# Patient Record
Sex: Male | Born: 1990 | Race: White | Hispanic: No | Marital: Married | Smoking: Current every day smoker
Health system: Southern US, Community
[De-identification: ages and names within clinical notes are randomized; demographics above are authoritative.]

---
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
## 2010-09-05 ENCOUNTER — Emergency Department: Payer: Self-pay | Admitting: Emergency Medicine

## 2012-01-23 ENCOUNTER — Emergency Department: Payer: Self-pay | Admitting: Emergency Medicine

## 2012-03-03 ENCOUNTER — Emergency Department: Payer: Self-pay | Admitting: Emergency Medicine

## 2012-03-03 LAB — COMPREHENSIVE METABOLIC PANEL
Anion Gap: 4 — ABNORMAL LOW (ref 7–16)
Chloride: 104 mmol/L (ref 98–107)
Co2: 32 mmol/L (ref 21–32)
EGFR (African American): >60
EGFR (Non-African Amer.): >60
Osmolality: 278 (ref 275–301)
SGOT(AST): 26 U/L (ref 15–37)
SGPT (ALT): 23 U/L (ref 12–78)

## 2012-03-03 LAB — CBC WITH DIFFERENTIAL/PLATELET
Basophil #: 0 10*3/uL (ref 0.0–0.1)
Eosinophil #: 0.2 10*3/uL (ref 0.0–0.7)
HCT: 45.8 % (ref 40.0–52.0)
HGB: 15.5 g/dL (ref 13.0–18.0)
Lymphocyte #: 1.8 10*3/uL (ref 1.0–3.6)
MCH: 30.7 pg (ref 26.0–34.0)
MCHC: 33.9 g/dL (ref 32.0–36.0)
MCV: 91 fL (ref 80–100)
Monocyte #: 0.6 x10 3/mm (ref 0.2–1.0)
Monocyte %: 9.1 %
Neutrophil #: 3.9 10*3/uL (ref 1.4–6.5)
Platelet: 176 10*3/uL (ref 150–440)
RDW: 12.9 % (ref 11.5–14.5)
WBC: 6.5 10*3/uL (ref 3.8–10.6)

## 2012-05-19 IMAGING — CR DG CHEST 2V
1 series · 2 of 2 positions shown · non-contrast
Comparison: none

REASON FOR EXAM: cough; flex 2
COMMENTS:

PROCEDURE:     DXR - DXR CHEST PA (OR AP) AND LATERAL  - September 05, 2010  [DATE]
RESULT:     The lungs are clear. The heart and pulmonary vessels are normal.
The bony and mediastinal structures are unremarkable. There is no effusion.
There is no pneumothorax or evidence of congestive failure.

[Series 1: view not recorded · 0.17mm/px · 2 of 2 slices shown]
[im 1/2]
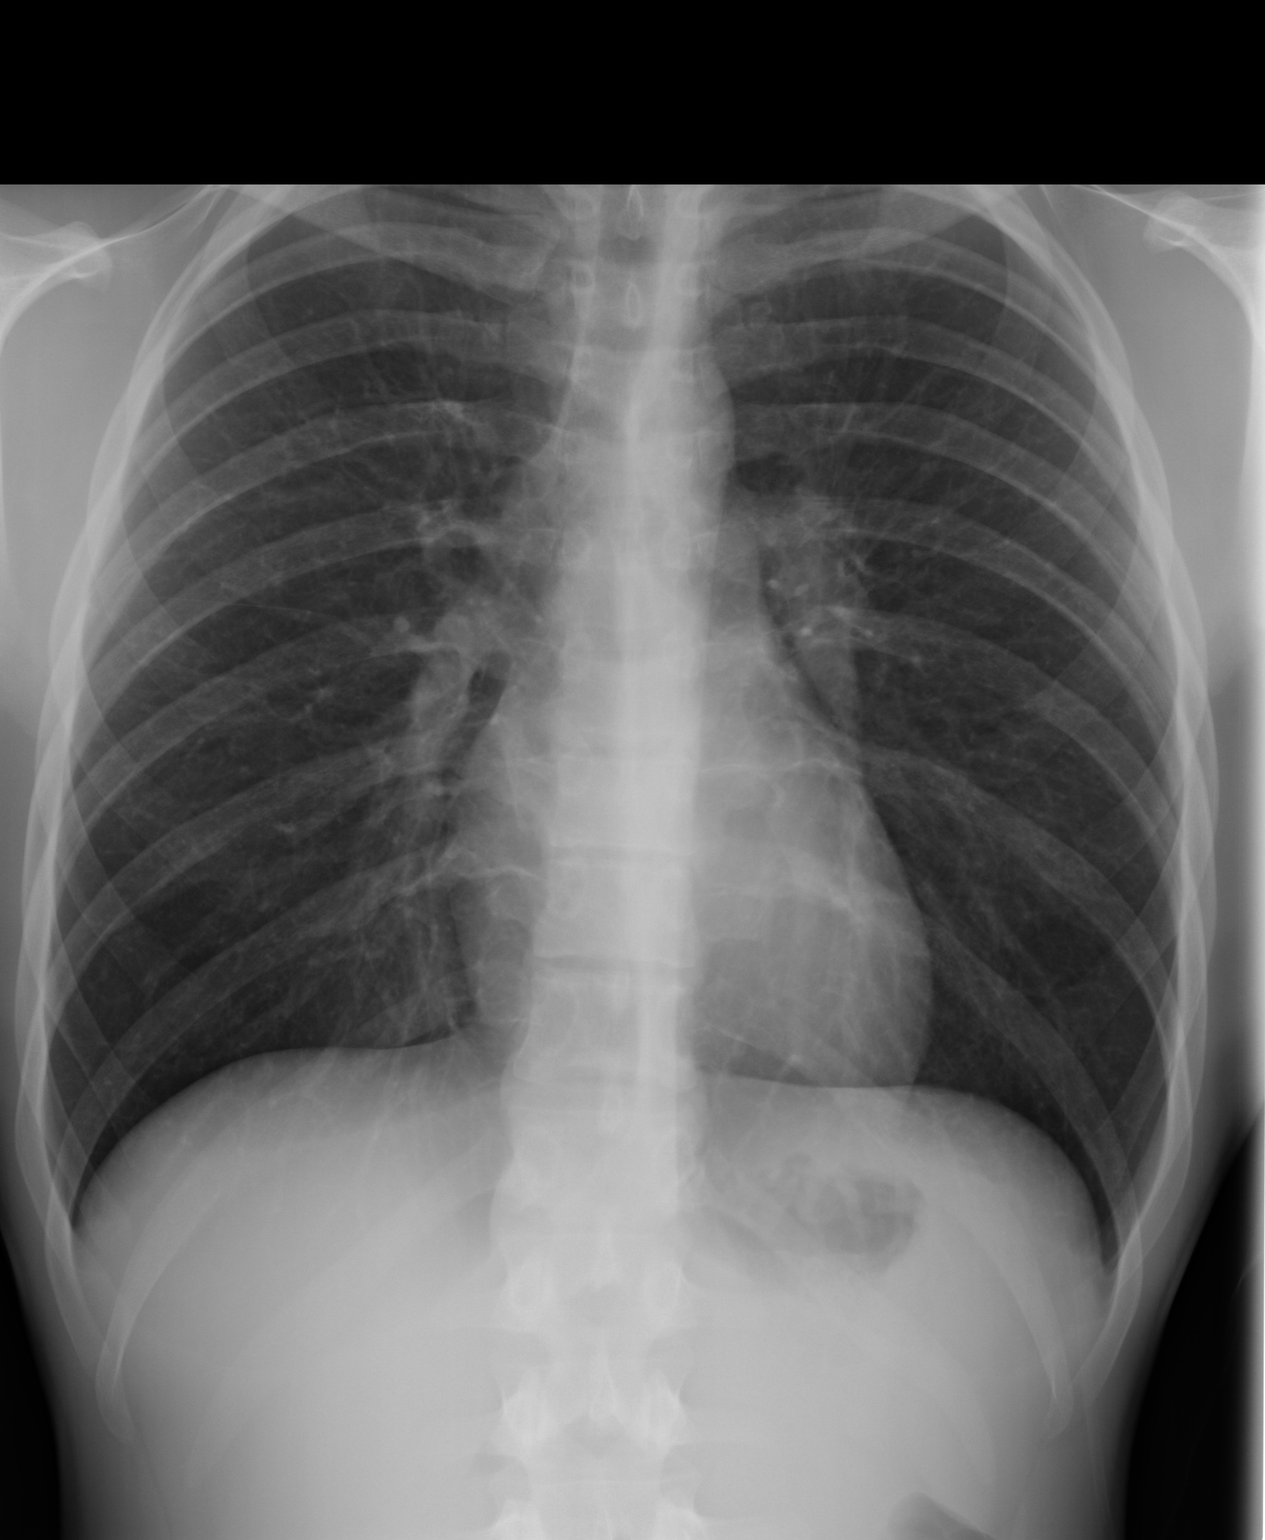
[im 2/2]
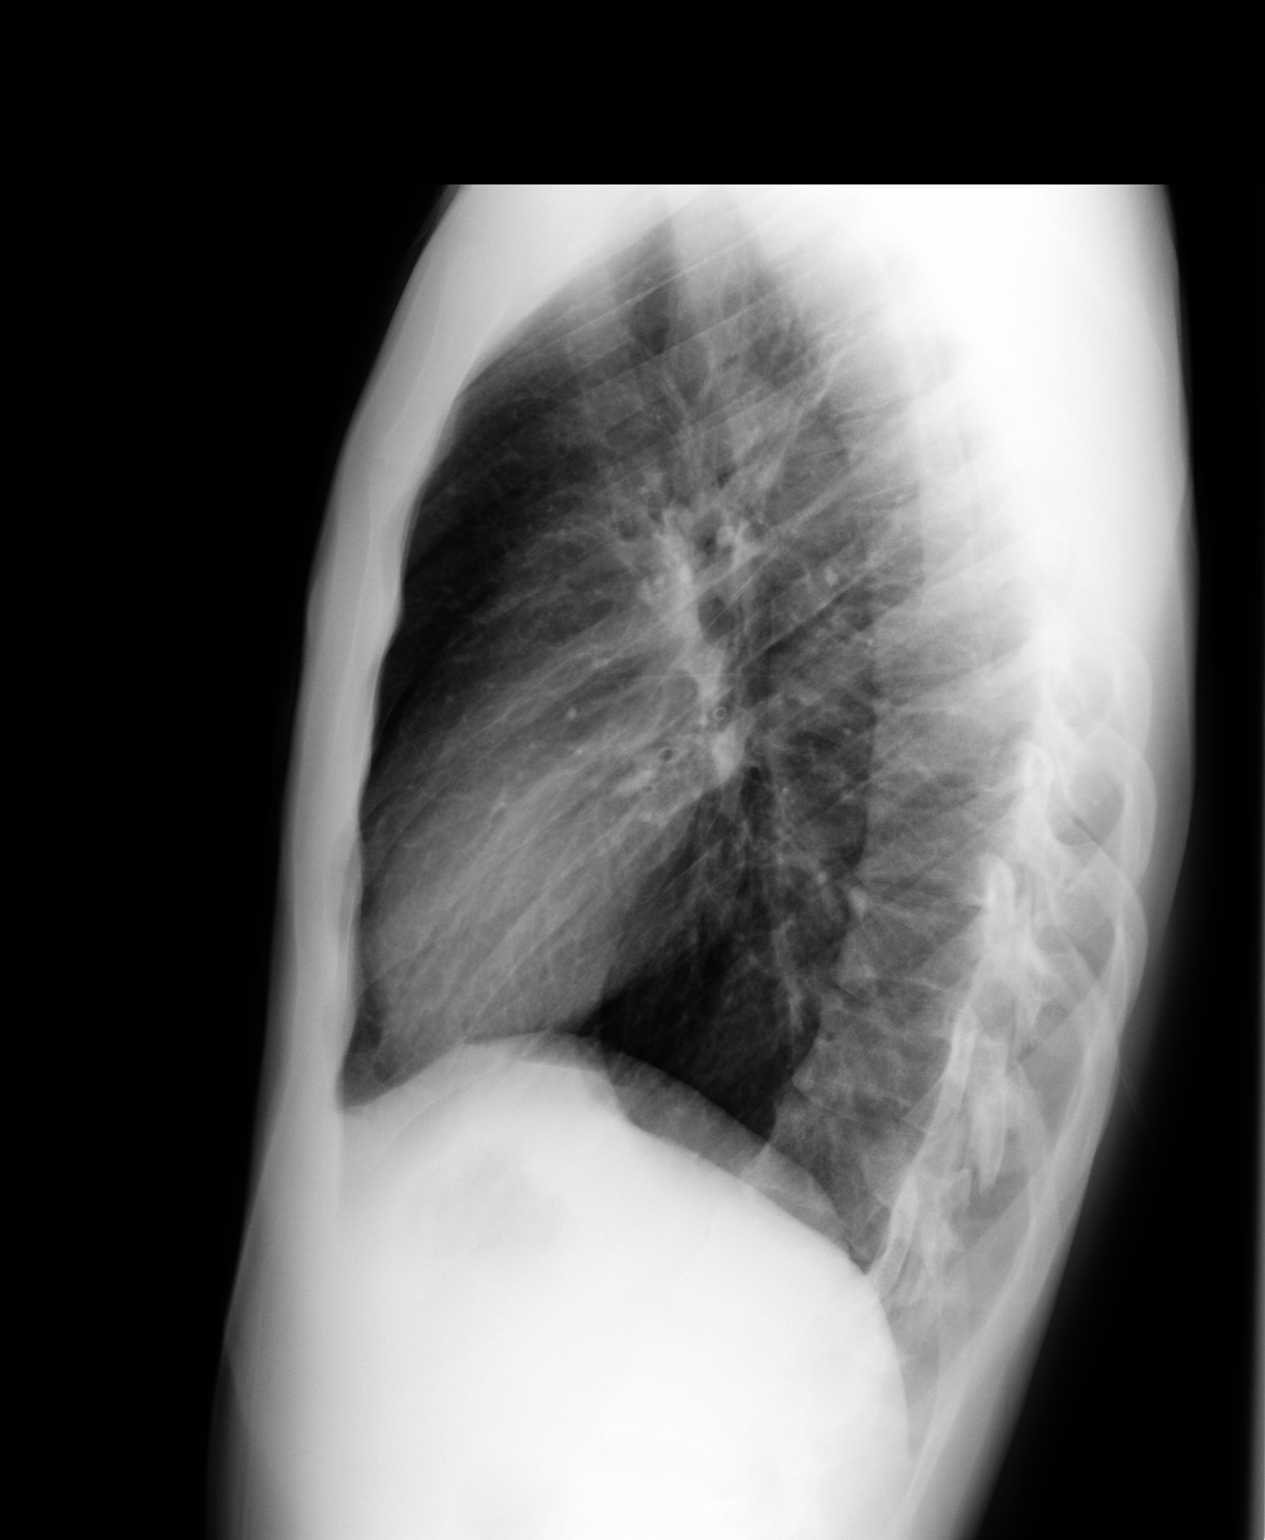

[2 of 2 positions shown; findings below may reference images not displayed]

IMPRESSION: No acute cardiopulmonary disease.

## 2012-08-08 ENCOUNTER — Ambulatory Visit: Payer: Self-pay

## 2012-10-08 ENCOUNTER — Emergency Department: Payer: Self-pay | Admitting: Internal Medicine

## 2013-03-01 ENCOUNTER — Emergency Department: Payer: Self-pay | Admitting: Emergency Medicine

## 2013-11-27 DEATH — deceased

## 2013-12-06 ENCOUNTER — Emergency Department: Payer: Self-pay | Admitting: Emergency Medicine

## 2014-02-06 ENCOUNTER — Emergency Department: Payer: Self-pay | Admitting: Emergency Medicine

## 2014-03-08 ENCOUNTER — Emergency Department: Payer: Self-pay | Admitting: Emergency Medicine

## 2014-10-25 ENCOUNTER — Encounter: Payer: Self-pay | Admitting: Emergency Medicine

## 2014-10-25 ENCOUNTER — Other Ambulatory Visit: Payer: Self-pay

## 2014-10-25 ENCOUNTER — Emergency Department
Admission: EM | Admit: 2014-10-25 | Discharge: 2014-10-25 | Discharge status: Against Medical Advice | Payer: Self-pay | Attending: Emergency Medicine | Admitting: Emergency Medicine

## 2014-10-25 DIAGNOSIS — R079 Chest pain, unspecified: Secondary | ICD-10-CM | POA: Insufficient documentation

## 2014-10-25 DIAGNOSIS — Z720 Tobacco use: Secondary | ICD-10-CM | POA: Insufficient documentation

## 2014-10-25 NOTE — ED Notes (Signed)
Patient states that he donated plasma today and tonight around 20:30 he developed chest pain.

## 2014-11-13 IMAGING — CR DG ANKLE COMPLETE 3+V*L*
1 series · 3 of 3 positions shown · non-contrast
Comparison: None.

CLINICAL DATA: Left ankle pain without trauma.

EXAM:
LEFT ANKLE COMPLETE - 3+ VIEW

[Series 1: ap · 0.17mm/px · 3 of 3 slices shown]
[im 1/3]
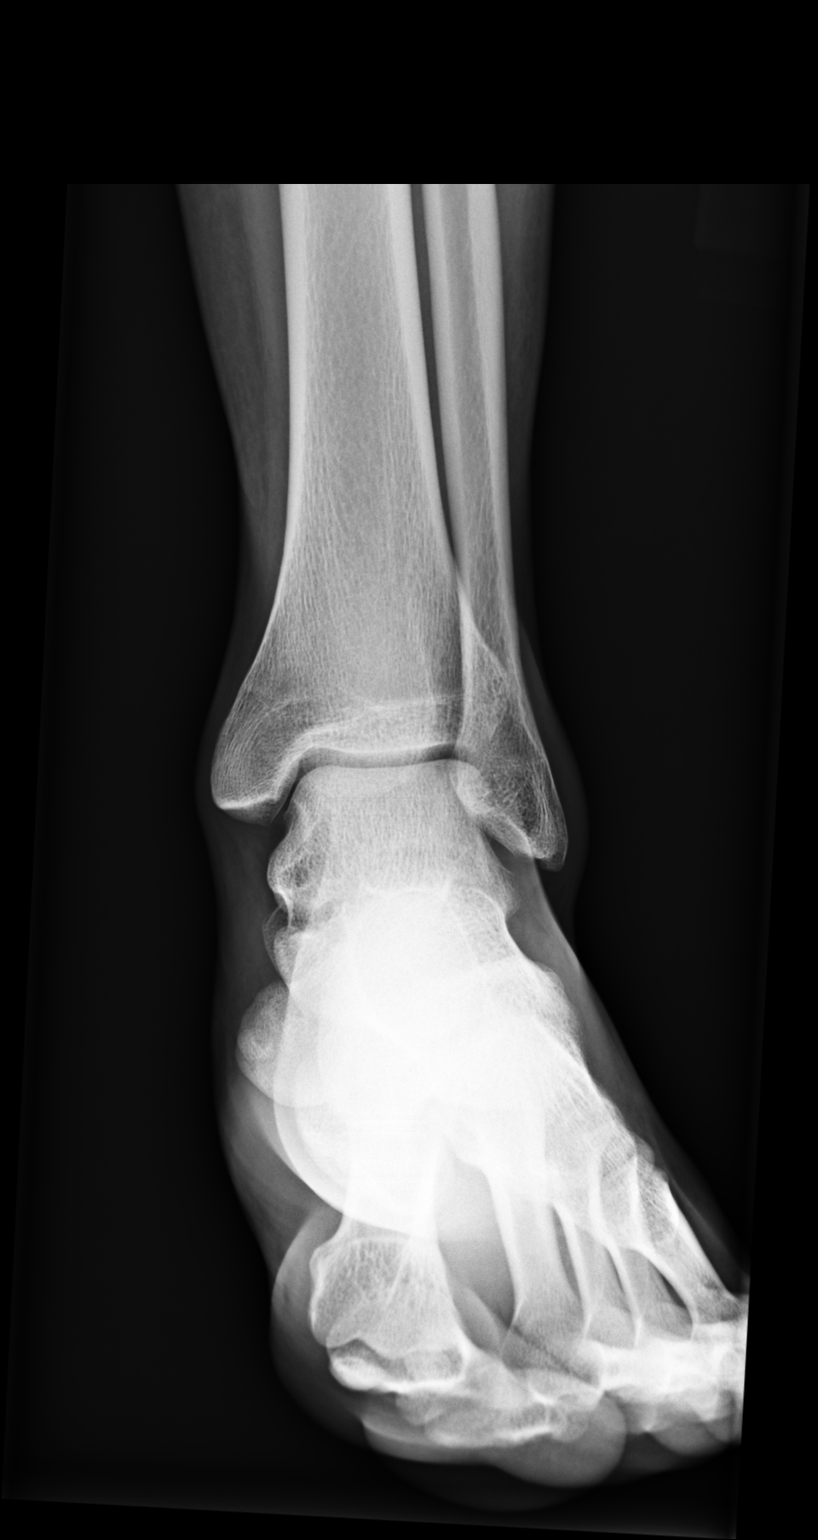
[im 2/3]
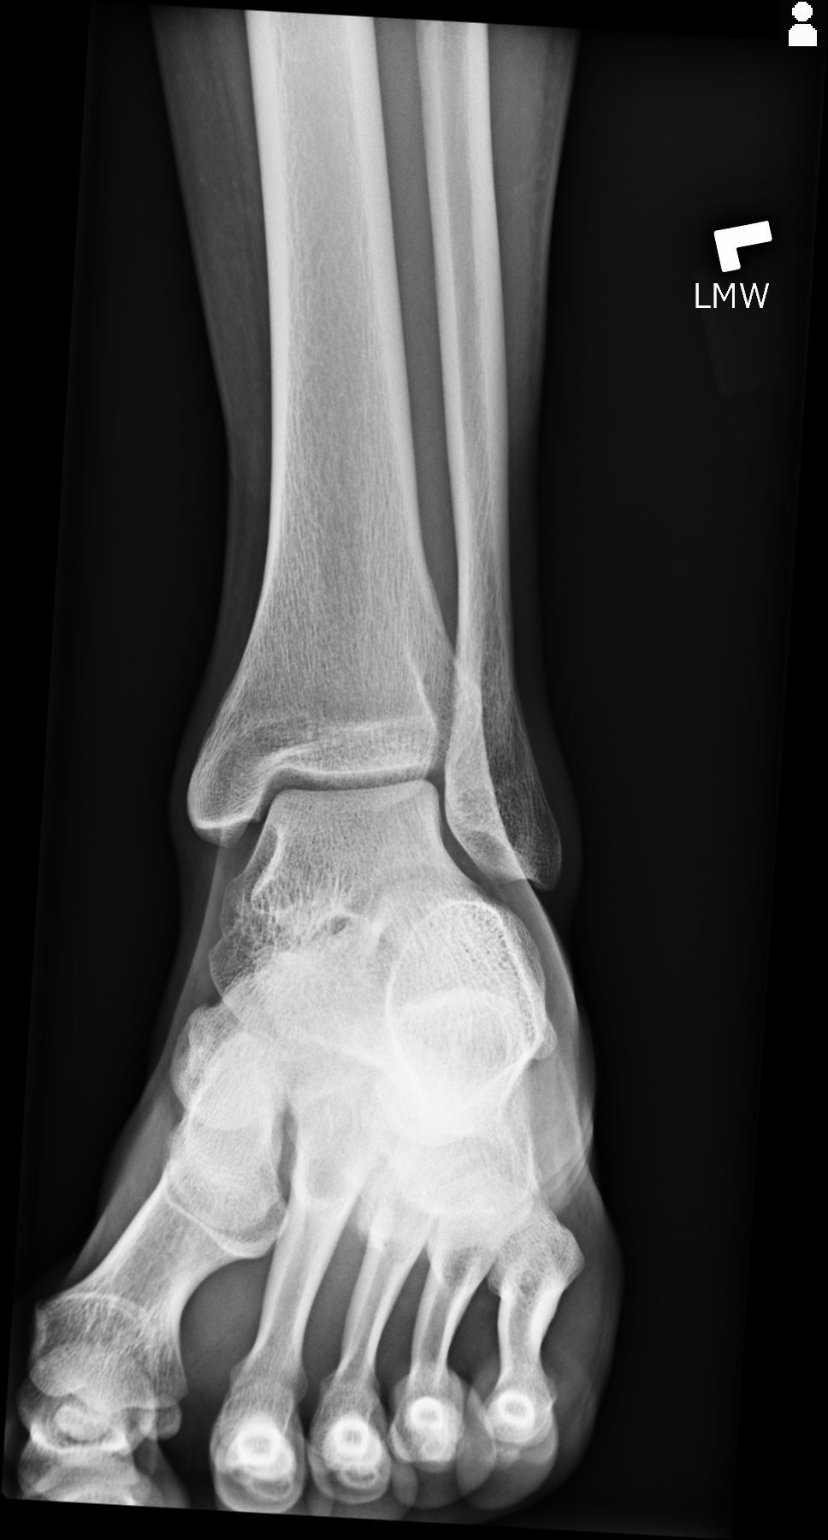
[im 3/3]
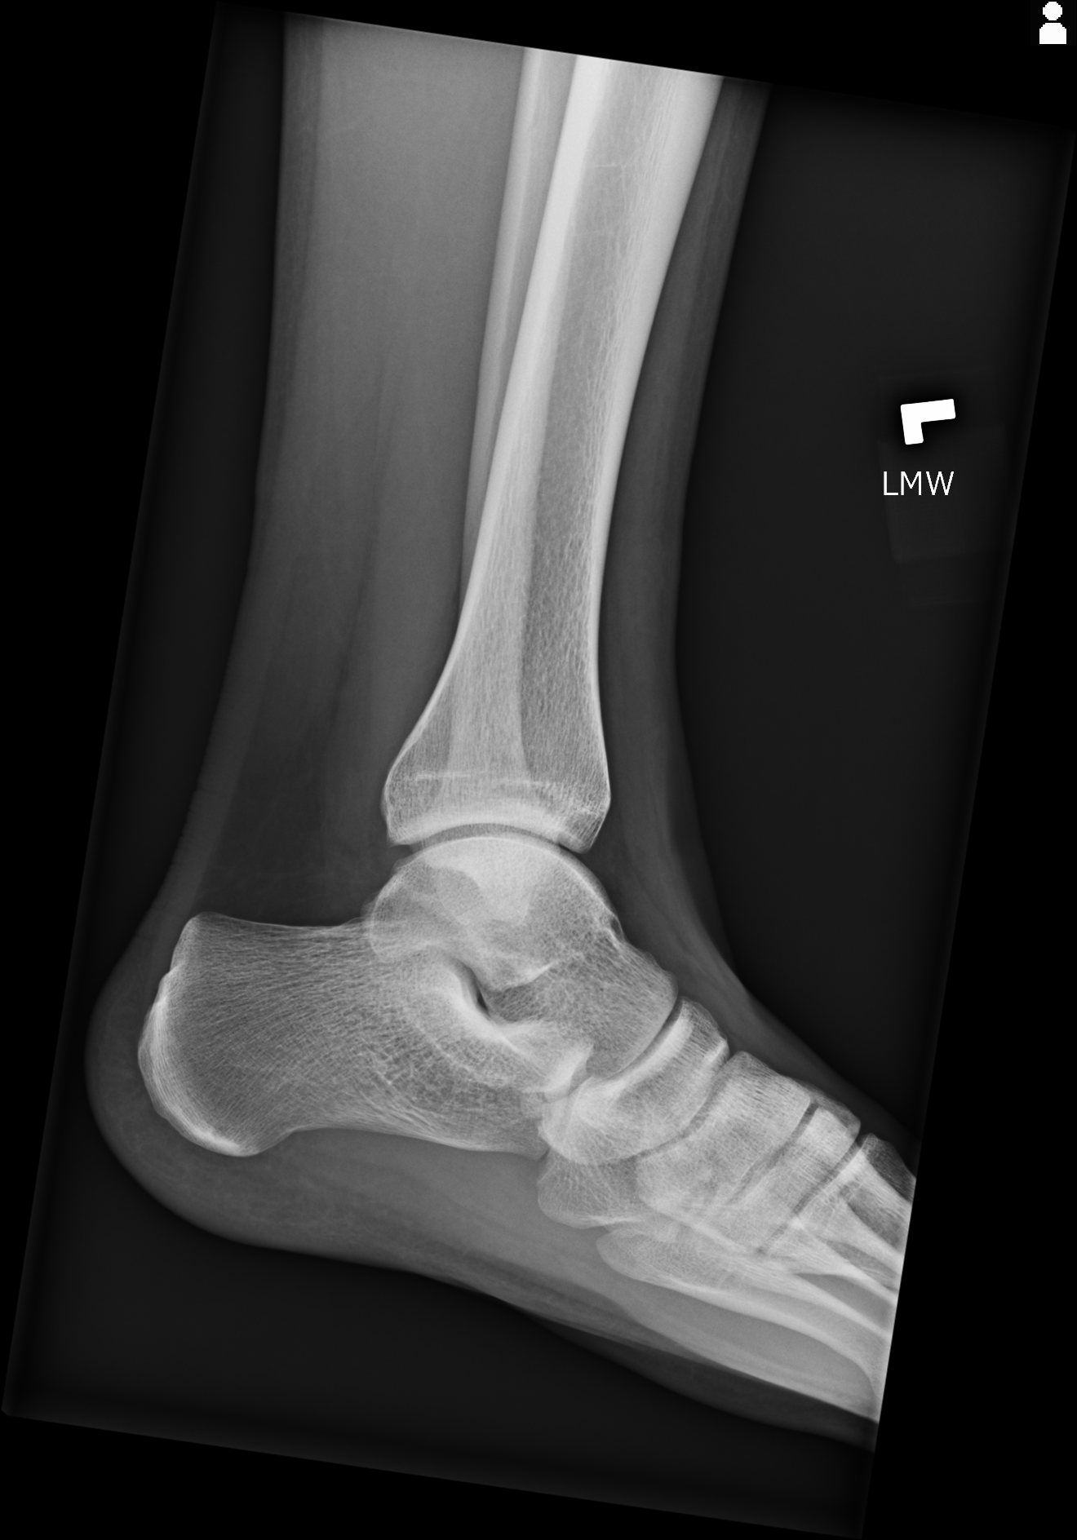

[3 of 3 positions shown; findings below may reference images not displayed]

FINDINGS: No acute fracture or dislocation. Tibiotalar osteoarthritis which is
mild. Base of fifth metatarsal and talar dome intact.
IMPRESSION: Degenerative change, without acute osseous finding.

## 2016-04-21 ENCOUNTER — Emergency Department
Admission: EM | Admit: 2016-04-21 | Discharge: 2016-04-21 | Disposition: A | Discharge status: Home / Self Care | Payer: Self-pay | Attending: Emergency Medicine | Admitting: Emergency Medicine

## 2016-04-21 ENCOUNTER — Encounter: Payer: Self-pay | Admitting: Emergency Medicine

## 2016-04-21 DIAGNOSIS — F17210 Nicotine dependence, cigarettes, uncomplicated: Secondary | ICD-10-CM | POA: Insufficient documentation

## 2016-04-21 DIAGNOSIS — F419 Anxiety disorder, unspecified: Secondary | ICD-10-CM

## 2016-04-21 DIAGNOSIS — F329 Major depressive disorder, single episode, unspecified: Secondary | ICD-10-CM

## 2016-04-21 DIAGNOSIS — F32A Depression, unspecified: Secondary | ICD-10-CM

## 2016-04-21 DIAGNOSIS — F418 Other specified anxiety disorders: Secondary | ICD-10-CM | POA: Insufficient documentation

## 2016-04-21 NOTE — BH Assessment (Signed)
Per the request of ER MD (Dr. York CeriseForbach), writer spoke with the patient about treatment options. Patient reports of "selfdestructed behaviors." He further explain his history of drug use. When asked for details and an example of what he was talking about his behaviors, he stated he was unable to explain it. He denies SI/HI and AV/H. He   Discussed patient with ER MD (Dr. York CeriseForbach) and patient is able to discharge home when medically cleared. Patient was giving referral information and instructions on how to follow up with Outpatient Treatment ([facility] and [facility]) and [facility].  Patient denies SI/HI and AV/H.

## 2016-04-21 NOTE — ED Notes (Signed)
BEHAVIORAL HEALTH ROUNDING Patient sleeping: No. Patient alert and oriented: yes Behavior appropriate: Yes.  ; If no, describe:  Nutrition and fluids offered: yes Toileting and hygiene offered: Yes  Sitter present: q15 minute observations and security  monitoring Law enforcement present: Yes  ODS  

## 2016-04-21 NOTE — ED Provider Notes (Signed)
[facility] Emergency Department Provider Note  ____________________________________________   First MD Initiated Contact with Patient 04/21/16 1640     (approximate)  I have reviewed the triage vital signs and the nursing notes.   HISTORY  Chief Complaint Anxiety    HPI Justin Nichols is a 26 y.o. male who denies any chronic medical issues who presents for evaluation of anxiety.  He states that he "feels like I am going to explode" and has been feeling like that for quite a while, gradually worsening over time.  He said there is not one specific thing that makes it worse, just the stress of work, family, life.  He adamantly denies suicidal and homicidal ideation, he just feels like he is having difficulty dealing with everything.  He has not ever sought any psychiatric care.  He states that when he was younger he [facility] and they recommended he see somebody as an outpatient but he never did so.  He has a girlfriend who is with him and supportive.  He denies any recent medical illnesses and denies any current medical symptoms as described in the review of systems below.  He admits to occasional alcohol use, daily tobacco use, and occasional marijuana use.  He states that he used to use painkillers recreationally but has not done that "in more than a year".  History reviewed. No pertinent past medical history.  There are no active problems to display for this patient.   History reviewed. No pertinent surgical history.  Prior to Admission medications   Not on File    Allergies Penicillins  No family history on file.  Social History Social History  Substance Use Topics  . Smoking status: Current Every Day Smoker    Packs/day: 1.00    Years: 4.00    Types: Cigarettes  . Smokeless tobacco: Never Used  . Alcohol use Yes    Review of Systems Constitutional: No fever/chills Eyes: No visual changes. ENT: No sore throat. Cardiovascular: Denies  chest pain. Respiratory: Denies shortness of breath. Gastrointestinal: No abdominal pain.  No nausea, no vomiting.  No diarrhea.  No constipation. Genitourinary: Negative for dysuria. Musculoskeletal: Negative for back pain. Skin: Negative for rash. Neurological: Negative for headaches, focal weakness or numbness. Psych:  Denies SI/HI.  +Anxiety, "feel like I'm going to explode" 10-point ROS otherwise negative.  ____________________________________________   PHYSICAL EXAM:  VITAL SIGNS: ED Triage Vitals  Enc Vitals Group     BP --      Pulse --      Resp --      Temp --      Temp src --      SpO2 --      Weight 04/21/16 1520 175 lb (79.4 kg)     Height 04/21/16 1520 6\' 3"  (1.905 m)     Head Circumference --      Peak Flow --      Pain Score 04/21/16 1532 3     Pain Loc --      Pain Edu? --      Excl. in GC? --     Constitutional: Alert and oriented. Well appearing and in no acute distress.  Disheveled, malodorous. Eyes: Conjunctivae are normal. PERRL. EOMI. Head: Atraumatic. Nose: No congestion/rhinnorhea. Mouth/Throat: Mucous membranes are moist.  Oropharynx non-erythematous.  Poor dentition Neck: No stridor.  No meningeal signs.   Cardiovascular: Normal rate, regular rhythm. Good peripheral circulation. Grossly normal heart sounds. Respiratory: Normal respiratory effort.  No retractions. Lungs CTAB. Gastrointestinal: Soft and nontender. No distention.  Musculoskeletal: No lower extremity tenderness nor edema. No gross deformities of extremities. Neurologic:  Normal speech and language. No gross focal neurologic deficits are appreciated.  Skin:  Skin is warm, dry and intact. No rash noted. Psychiatric: Mood and affect are depressed.  Denies SI/HI.  Denies hallucinations.  ____________________________________________   LABS (all labs ordered are listed, but only abnormal results are displayed)  Labs Reviewed  COMPREHENSIVE METABOLIC PANEL  ETHANOL  SALICYLATE  LEVEL  ACETAMINOPHEN LEVEL  CBC  URINE DRUG SCREEN, QUALITATIVE ([facility] ONLY)   ____________________________________________  EKG  None - EKG not ordered by ED physician ____________________________________________  RADIOLOGY   No results found.  ____________________________________________   PROCEDURES  Procedure(s) performed:   Procedures   Critical Care performed: No ____________________________________________   INITIAL IMPRESSION / ASSESSMENT AND PLAN / ED COURSE  Pertinent labs & imaging results that were available during my care of the patient were reviewed by me and considered in my medical decision making (see chart for details).  The patient does appear to be suffering from some depression if not anxiety.  However he does not meet any criteria for inpatient admission nor for involuntary commitment.  He has the ability to follow up as an outpatient and I do not feel that keeping him in the emergency department for psychiatric evaluation would be advantageous.  Given his history of substance abuse including both opioids and marijuana and occasional alcohol use I am not comfortable prescribing benzodiazepines.  I feel like this is more likely to lead to abuse that it is to help him with his issue.  He needs CBT and/or close outpatient care and possibly an antidepressant.  Spinous to him and encouraged that he follow up at the next available opportunity with [facility].  Calvin from behavioral medicine will bring him some outpatient resources.  I gave the patient strict return precautions if he were to develop suicidal ideation that he should return immediately.  He understands and agrees with the plan.      ____________________________________________  FINAL CLINICAL IMPRESSION(S) / ED DIAGNOSES  Final diagnoses:  Anxiety  Depression, unspecified depression type     MEDICATIONS GIVEN DURING THIS VISIT:  Medications - No data to display   NEW OUTPATIENT  MEDICATIONS STARTED DURING THIS VISIT:  New Prescriptions   No medications on file    Modified Medications   No medications on file    Discontinued Medications   No medications on file     Note:  This document was prepared using Dragon voice recognition software and may include unintentional dictation errors.    Loleta Rose, MD 04/21/16 [phone]

## 2016-04-21 NOTE — ED Triage Notes (Signed)
Pt comes into the ED via POV c/o anxiety that has been increasing over the past couple of weeks.  Patient denies any SI, HI, or hallucinations.  Patient denies any new stress that has occurred but states he "feels out of control and anxious".  Patient in NAD at this time with even and unlabored respirations.

## 2016-04-21 NOTE — Discharge Instructions (Signed)
You have been seen in the Emergency Department (ED) today for a psychiatric complaint.  You have been evaluated  and we believe you are safe to be discharged from the hospital.  We provided some outpatient resources and strongly encourage you to follow up with [facility] at the next available opportuntiy (please call them either today or first thing in the morning).  Please return to the ED immediately if you have ANY thoughts of hurting yourself or anyone else, so that we may help you.  Please avoid alcohol and drug use.  Follow up with your doctor and/or therapist as soon as possible regarding today's ED visit.   Please follow up any other recommendations and clinic appointments provided by the psychiatry team that saw you in the Emergency Department.

## 2016-04-21 NOTE — ED Notes (Signed)

## 2021-03-12 ENCOUNTER — Encounter: Payer: Self-pay | Admitting: Emergency Medicine

## 2021-03-12 ENCOUNTER — Emergency Department
Admission: EM | Admit: 2021-03-12 | Discharge: 2021-03-12 | Disposition: A | Discharge status: Home / Self Care | Payer: Self-pay | Attending: Student in an Organized Health Care Education/Training Program | Admitting: Student in an Organized Health Care Education/Training Program

## 2021-03-12 ENCOUNTER — Other Ambulatory Visit: Payer: Self-pay

## 2021-03-12 DIAGNOSIS — F17210 Nicotine dependence, cigarettes, uncomplicated: Secondary | ICD-10-CM | POA: Insufficient documentation

## 2021-03-12 DIAGNOSIS — L0291 Cutaneous abscess, unspecified: Secondary | ICD-10-CM

## 2021-03-12 DIAGNOSIS — L02414 Cutaneous abscess of left upper limb: Secondary | ICD-10-CM | POA: Insufficient documentation

## 2021-03-12 MED ORDER — SULFAMETHOXAZOLE-TRIMETHOPRIM 800-160 MG PO TABS: 1.0000 | ORAL_TABLET | Freq: Two times a day (BID) | ORAL | 0 refills | Status: AC

## 2021-03-12 NOTE — ED Notes (Signed)
Wrapped arm in gauze per pt request.

## 2021-03-12 NOTE — ED Triage Notes (Signed)
Pt comes into the ED via POV c/o abscess to the left forearm and right knee.  PT states he has a h/o staph earlier this year.  Pt has purulent drainage coming from the area on his forearm, but the one on his knee is healing with a scab in place.  Pt in NAD at this time.

## 2021-03-12 NOTE — ED Provider Notes (Signed)
[facility] Emergency Department Provider Note  ____________________________________________   Event Date/Time   First MD Initiated Contact with Patient 03/12/21 1253     (approximate)  I have reviewed the triage vital signs and the nursing notes.   HISTORY  Chief Complaint Abscess    HPI Justin Nichols is a 30 y.o. male presents to the ED with complaint of multiple skin infections and a history of staph earlier this year.  Patient states that he has purulent drainage from several areas.  He denies any fever or chills.  He reports that he currently does not have any insurance.  He rates pain as 5 out of 10.         History reviewed. No pertinent past medical history.  There are no problems to display for this patient.   History reviewed. No pertinent surgical history.  Prior to Admission medications   Medication Sig Start Date End Date Taking? Authorizing Provider  sulfamethoxazole-trimethoprim (BACTRIM DS) 800-160 MG tablet Take 1 tablet by mouth 2 (two) times daily. 03/12/21  Yes Bridget Hartshorn L, PA-C    Allergies Penicillins  History reviewed. No pertinent family history.  Social History Social History   Tobacco Use   Smoking status: Every Day    Packs/day: 1.00    Years: 4.00    Pack years: 4.00    Types: Cigarettes   Smokeless tobacco: Never  Substance Use Topics   Alcohol use: Yes   Drug use: Yes    Types: Marijuana    Review of Systems Constitutional: No fever/chills Eyes: No visual changes. ENT: No sore throat. Cardiovascular: Denies chest pain. Respiratory: Denies shortness of breath. Gastrointestinal: No abdominal pain.  No nausea, no vomiting.  No diarrhea.   Genitourinary: Negative for dysuria. Musculoskeletal: Negative for back pain. Skin: Positive for multiple skin infections. Neurological: Negative for headaches, focal weakness or numbness.  ____________________________________________   PHYSICAL  EXAM:  VITAL SIGNS: ED Triage Vitals  Enc Vitals Group     BP 03/12/21 1220 130/82     Pulse Rate 03/12/21 1220 (!) 101     Resp 03/12/21 1220 18     Temp 03/12/21 1220 98.3 F (36.8 C)     Temp Source 03/12/21 1220 Oral     SpO2 03/12/21 1220 100 %     Weight 03/12/21 1227 175 lb 0.7 oz (79.4 kg)     Height 03/12/21 1227 6\' 6"  (1.981 m)     Head Circumference --      Peak Flow --      Pain Score 03/12/21 1227 5     Pain Loc --      Pain Edu? --      Excl. in GC? --     Constitutional: Alert and oriented. Well appearing and in no acute distress. Eyes: Conjunctivae are normal.  Head: Atraumatic. Nose: No congestion/rhinnorhea. Neck: No stridor.   Cardiovascular: Normal rate, regular rhythm. Grossly normal heart sounds.  Good peripheral circulation. Respiratory: Normal respiratory effort.  No retractions. Lungs CTAB. Musculoskeletal: Moves upper and lower extremities any difficulty.  Normal gait was noted. Neurologic:  Normal speech and language. No gross focal neurologic deficits are appreciated.  Skin:  Skin is warm, dry.  There is a single superficial open wound to the left forearm with yellow drainage, left lower extremity without purulent drainage.  No soft tissue edema present. Psychiatric: Mood and affect are normal. Speech and behavior are normal.  ____________________________________________   LABS (all labs ordered are  listed, but only abnormal results are displayed)  Labs Reviewed - No data to display   PROCEDURES  Procedure(s) performed (including Critical Care):  Procedures   ____________________________________________   INITIAL IMPRESSION / ASSESSMENT AND PLAN / ED COURSE  As part of my medical decision making, I reviewed the following data within the electronic MEDICAL RECORD NUMBER Notes from prior ED visits  30 year old male presents to the ED with history of staph infection in the past and currently has a superficial area to his left forearm and to  his right knee.  Patient had a episode earlier this year.  A prescription for Bactrim DS was [facility] to [facility] as the price on good Rx was such that he can afford it.  Good Rx card was given to him.  He was also given instructions to clean area daily with mild soap and water and to watch these areas.  He also currently does not have a PCP as he is lost his insurance and was given [facility] if medical attention is needed.   ____________________________________________   FINAL CLINICAL IMPRESSION(S) / ED DIAGNOSES  Final diagnoses:  Cutaneous abscess, unspecified site     ED Discharge Orders          Ordered    sulfamethoxazole-trimethoprim (BACTRIM DS) 800-160 MG tablet  2 times daily        03/12/21 1321             Note:  This document was prepared using Dragon voice recognition software and may include unintentional dictation errors.    Tommi Rumps, PA-C 03/12/21 1336    Willy Eddy, MD 03/12/21 1346

## 2021-03-12 NOTE — Discharge Instructions (Signed)
Follow-up with PCP of your choice or urgent care.  Also since you do not have insurance and if future medical needs are necessary call the [facility] to see if you are eligible and to begin filling out paperwork so that she can be seen there for free.  The prescription for Bactrim was [facility] and usual good Rx card.  Keep areas clean and dry.  Clean daily with mild soap and water.
